# Patient Record
Sex: Male | Born: 1995 | Race: White | Hispanic: No | Marital: Single | Smoking: Never smoker
Health system: Southern US, Community
[De-identification: ages and names within clinical notes are randomized; demographics above are authoritative.]

## PROBLEM LIST (undated history)

## (undated) DIAGNOSIS — R0683 Snoring: Secondary | ICD-10-CM

## (undated) DIAGNOSIS — M419 Scoliosis, unspecified: Secondary | ICD-10-CM

## (undated) DIAGNOSIS — T7840XA Allergy, unspecified, initial encounter: Secondary | ICD-10-CM

## (undated) DIAGNOSIS — I739 Peripheral vascular disease, unspecified: Secondary | ICD-10-CM

## (undated) HISTORY — DX: Scoliosis, unspecified: M41.9

## (undated) HISTORY — DX: Allergy, unspecified, initial encounter: T78.40XA

## (undated) HISTORY — DX: Peripheral vascular disease, unspecified: I73.9

## (undated) HISTORY — DX: Snoring: R06.83

---
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
## 2014-08-09 ENCOUNTER — Encounter: Payer: Self-pay | Admitting: Pediatrics

## 2014-10-15 ENCOUNTER — Ambulatory Visit (INDEPENDENT_AMBULATORY_CARE_PROVIDER_SITE_OTHER): Payer: BLUE CROSS/BLUE SHIELD | Admitting: Pediatrics

## 2014-10-15 DIAGNOSIS — Z111 Encounter for screening for respiratory tuberculosis: Secondary | ICD-10-CM

## 2014-10-15 NOTE — Progress Notes (Signed)
Patient received a PPD on left forearm. No reaction noted. [place] [employer] was formed. Patient must return in 48-72 hours to have test read. If test not read within time frame patient must have test redone. Patient does not have forms to fill out but will need a copy of immunization record and results printed out when patient returns and be faxed in. Patient will have fax number on Wednesday for us, he was not sure of the number today. Lot #: [id number] Expire: 02/17 NDC: [id number]

## 2014-10-17 ENCOUNTER — Ambulatory Visit (INDEPENDENT_AMBULATORY_CARE_PROVIDER_SITE_OTHER): Payer: Self-pay | Admitting: Pediatrics

## 2014-10-17 DIAGNOSIS — Z111 Encounter for screening for respiratory tuberculosis: Secondary | ICD-10-CM

## 2014-10-17 DIAGNOSIS — Z7689 Persons encountering health services in other specified circumstances: Secondary | ICD-10-CM

## 2014-10-17 LAB — TB SKIN TEST
INDURATION: 0 mm
TB Skin Test: NEGATIVE

## 2014-10-17 NOTE — Progress Notes (Signed)
PPD 0 mm

## 2015-04-23 ENCOUNTER — Other Ambulatory Visit: Payer: Self-pay | Admitting: *Deleted

## 2015-04-23 DIAGNOSIS — I83812 Varicose veins of left lower extremities with pain: Secondary | ICD-10-CM

## 2015-04-26 ENCOUNTER — Encounter: Payer: Self-pay | Admitting: Vascular Surgery

## 2015-05-01 ENCOUNTER — Encounter: Payer: Self-pay | Admitting: Vascular Surgery

## 2015-05-01 ENCOUNTER — Encounter (HOSPITAL_COMMUNITY): Payer: Self-pay

## 2015-05-01 ENCOUNTER — Ambulatory Visit (INDEPENDENT_AMBULATORY_CARE_PROVIDER_SITE_OTHER): Payer: BLUE CROSS/BLUE SHIELD | Admitting: Vascular Surgery

## 2015-05-01 ENCOUNTER — Ambulatory Visit (HOSPITAL_COMMUNITY)
Admission: RE | Admit: 2015-05-01 | Discharge: 2015-05-01 | Disposition: A | Discharge status: Home / Self Care | Payer: BLUE CROSS/BLUE SHIELD | Source: Ambulatory Visit | Attending: Vascular Surgery | Admitting: Vascular Surgery

## 2015-05-01 VITALS — BP 103/66 | HR 78 | Temp 97.1°F | Resp 16 | Ht 71.0 in | Wt 152.0 lb

## 2015-05-01 DIAGNOSIS — I872 Venous insufficiency (chronic) (peripheral): Secondary | ICD-10-CM

## 2015-05-01 DIAGNOSIS — I83812 Varicose veins of left lower extremities with pain: Secondary | ICD-10-CM | POA: Diagnosis not present

## 2015-05-01 NOTE — Progress Notes (Signed)
[facility]  Patient name: Erik Courteter G Lucente MRN: [id number] DOB: 05/05/1996 Sex: male  REASON FOR CONSULT: Varicose veins left leg  HPI: Erik Hunter is a 19 y.o. 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 These have gradually gotten worse and therefore he comes in for a vascular evaluation. He does experiencing aching pain in his legs with prolonged standing. Otherwise the veins are not especially symptomatic. He denies any previous history of DVT. It sounds like he had an episode of phlebitis back in October. A soccer ball got him in the back of the left calf and he had some significant bruising and induration in this area. Slowly this resolved.  He does have some family history of varicose veins in that his grandmother had real problems with varicose veins.  Past Medical History  Diagnosis Date  . Snoring   . Allergy   . Asthma   . Scoliosis     History reviewed. No pertinent family history.  His grandmother had varicose veins.  SOCIAL HISTORY: Social History   Social History  . Marital Status: Single    Spouse Name: N/A  . Number of Children: N/A  . Years of Education: N/A   Occupational History  . Not on file.   Social History Main Topics  . Smoking status: Never Smoker   . Smokeless tobacco: Never Used  . Alcohol Use: No  . Drug Use: No  . Sexual Activity: No   Other Topics Concern  . Not on file   Social History Narrative    Allergies  Allergen Reactions  . Peanut-Containing Drug Products     Current Outpatient Prescriptions  Medication Sig Dispense Refill  . EPINEPHrine (EPIPEN) 0.3 mg/0.3 mL SOAJ injection Inject 0.3 mLs (0.3 mg total) into the muscle once. 2 Device 3  . MINOCYCLINE HCL PO Take 1 tablet by mouth 2 (two) times daily.     No current facility-administered medications for this visit.    REVIEW OF SYSTEMS:  [X]  denotes positive finding, [ ]  denotes negative finding Cardiac   Comments:  Chest pain or chest pressure:    Shortness of breath upon exertion:    Short of breath when lying flat:    Irregular heart rhythm:        Vascular    Pain in calf, thigh, or hip brought on by ambulation:    Pain in feet at night that wakes you up from your sleep:     Blood clot in your veins:    Leg swelling:         Pulmonary    Oxygen at home:    Productive cough:     Wheezing:         Neurologic    Sudden weakness in arms or legs:     Sudden numbness in arms or legs:     Sudden onset of difficulty speaking or slurred speech:    Temporary loss of vision in one eye:     Problems with dizziness:         Gastrointestinal    Blood in stool:     Vomited blood:         Genitourinary    Burning when urinating:     Blood in urine:        Psychiatric    Major depression:         Hematologic    Bleeding problems:  Problems with blood clotting too easily:        Skin    Rashes or ulcers:        Constitutional    Fever or chills:      PHYSICAL EXAM: Filed Vitals:   05/01/15 1040  BP: 103/66  Pulse: 78  Temp: 97.1 F (36.2 C)  TempSrc: Oral  Resp: 16  Height:  (1.803 m)  Weight: 152 lb (68.947 kg)  SpO2: 99%    GENERAL: The patient is a well-nourished male, in no acute distress. The vital signs are documented above. CARDIAC: There is a regular rate and rhythm.  VASCULAR: I do not detect carotid bruits. He has palpable femoral, popliteal, and pedal pulses. PULMONARY: There is good air exchange bilaterally without wheezing or rales. ABDOMEN: Soft and non-tender with normal pitched bowel sounds.  MUSCULOSKELETAL: There are no major deformities or cyanosis. NEUROLOGIC: No focal weakness or paresthesias are detected. SKIN: he has a cluster of varicose veins in his posterior left calf. PSYCHIATRIC: The patient has a normal affect.  DATA:   LOWER EXTREMITY VENOUS DUPLEX: I have independently interpreted his left lower extremity venous duplex  evaluation. He has no evidence of DVT. He does have deep vein reflux in the common femoral vein, femoral vein, and popliteal vein. There is some reflux in the saphenofemoral junction but no reflux in the great saphenous vein. He does have significant reflux in the small saphenous vein on the left.  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 In addition he has deep venous reflux proximal to that. We have discussed the importance of intermittent leg elevation in the proper positioning for this. In addition I have written him a prescription for knee-high compression stockings with a gradient of 15-20 mmHg. I have encouraged him to avoid prolonged sitting and standing. Walking and exercise is good. Also explained getting in the pool was good as it also provide some compression of the veins. If his symptoms progress he could potentially be considered for laser ablation of the short saphenous vein although we would try him in thigh-high compression stockings with a gradient of 20-30 mmHg before considering this. He will let us know if his symptoms progress and we'll plan on seeing him back as needed.   Waverly Ferrari [facility] Beeper: [phone]

## 2015-05-20 ENCOUNTER — Other Ambulatory Visit: Payer: Self-pay | Admitting: Urology

## 2015-05-20 DIAGNOSIS — I861 Scrotal varices: Secondary | ICD-10-CM

## 2015-07-25 ENCOUNTER — Ambulatory Visit
Admission: RE | Admit: 2015-07-25 | Discharge: 2015-07-25 | Disposition: A | Discharge status: Home / Self Care | Payer: Self-pay | Source: Ambulatory Visit | Attending: Urology | Admitting: Urology

## 2015-07-25 DIAGNOSIS — I861 Scrotal varices: Secondary | ICD-10-CM

## 2015-07-25 NOTE — Consult Note (Signed)
Chief Complaint: Patient was seen in consultation today for treatment of a left varicocele at the request of Erik Hunter  Referring Physician(s): Hunter,Erik  History of Present Illness: Erik Hunter is a 20 y.o. male with a history of a known left varicocele since roughly the age of 20 years old. The varicocele has become increasingly prominent over time. He does complain of some occasional discomfort while sitting, in the region of the left scrotum, but does not have any significant pain with exertion or exercise. He has not had any documentation of a right-sided varicocele. Erik Hunter does have a history of a palpable posterior left calf varicose vein which has been evaluated by Dr. Gae Hunter. This is asymptomatic and he has not undergone any varicose vein treatment.  On physical examination, it has been noted that he has mild testicular atrophy and a prominent palpable varicocele. He did undergo sperm analysis on 05/15/2015 demonstrating mild reduction in sperm concentration, diminished sperm motility, diminished sperm forward progression and decrease in normal sperm forms. Total sperm count was within normal limits.  Past Medical History  Diagnosis Date  . Snoring   . Allergy   . Asthma   . Scoliosis   The patient's primary care physician is Erik Hunter.  No past surgical history on file.  Allergies: Peanut-containing drug products  Medications: Prior to Admission medications   Medication Sig Start Date End Date Taking? Authorizing Provider  EPINEPHrine (EPIPEN) 0.3 mg/0.3 mL SOAJ injection Inject 0.3 mLs (0.3 mg total) into the muscle once. 12/29/12   Erik Solders, MD  MINOCYCLINE HCL PO Take 1 tablet by mouth 2 (two) times daily.    Historical Provider, MD     No family history on file.  Social History   Social History  . Marital Status: Single    Spouse Name: N/A  . Number of Children: N/A  . Years of Education: N/A   Social History Main  Topics  . Smoking status: Never Smoker   . Smokeless tobacco: Never Used  . Alcohol Use: 0.0 oz/week    0 Standard drinks or equivalent per week     Comment: Occasionally   . Drug Use: No  . Sexual Activity: No   Other Topics Concern  . Not on file   Social History Narrative  Junior in college at [employer] and [employer].  He is premed and interested in medicine. Father Erik Hunter is a Copywriter, advertising in town.   Review of Systems: A 12 point ROS discussed and pertinent positives are indicated in the HPI above.  All other systems are negative.  Review of Systems  Constitutional: Negative.   HENT: Negative.   Respiratory: Negative.   Cardiovascular: Negative.   Gastrointestinal: Negative.   Endocrine: Negative.   Genitourinary: Negative.   Musculoskeletal: Negative.   Skin: Negative.   Neurological: Negative.      Vital Signs: BP 104/59 mmHg  Pulse 78  Temp(Src) 97.7 F (36.5 C) (Oral)  Resp 14  Ht _0  (1.803 m)  Wt 160 lb (72.576 kg)  BMI 22.33 kg/m2  SpO2 99%  Physical Exam  Constitutional: He appears well-developed and well-nourished. No distress.  Genitourinary: Penis normal.  No palpable inguinal lymph nodes or hernias.  Scrotal exam demonstrates a 3+ palpable left varicocele which distends with Valsalva maneuver.  The left testis is approximately 25-30% smaller in volume by palpation compared to the right.  No palpable right varicocele.  No palpable testicular or scrotal masses.  Skin: He  is not diaphoretic.  Nursing note and vitals reviewed.   Imaging: No results found.  Labs:  None  Sperm analysis from [facility] received by fax and reviewed.  Assessment and Plan:  I met with Erik Hunter, his mother and his father Erik Hunter.  We reviewed the pathophysiology of varicocele and indications for treatment. Treatment techniques were discussed including surgical ligation and transcatheter embolization to treat varicocele.  The current clinical indication to treat would be for the prevention of potential future infertility given abnormal sperm analysis and evidence of left testicular atrophy on physical examination. There is a prominent varicocele palpable on physical examination which shows distention with Valsalva maneuver, consistent with testicular vein incompetence.  Details of transcatheter embolization were discussed including technical details of catheter placement, coil embolization and expected postprocedural course. Potential complications and recovery were also discussed including the risk of bleeding and the potential for some acute inflammation secondary to thrombophlebitis of the embolized testicular vein branches and main left testicular vein. Transcatheter venography would be performed prior to embolization to confirm the presence of retrograde flow in the left testicular vein. Expected outcomes were also discussed including risk of recurrent recanalization of a varicocele after either surgical ligation or embolization. Recovery from a transcatheter procedure is usually quite quick and the only limitation would be initial gradual increase in activity, including exercise, and not being able to travel by airplane for one month.  The procedure is performed on an outpatient basis with IV conscious sedation. After discussing the procedure, Erik Hunter was like to ultimately have the varicocele treated by transcatheter means. We will begin the authorization process. He is currently favoring having treatment sometime in the summer after school is out and after taking the MCAT.  Thank you for this interesting consult.  I greatly enjoyed meeting Erik Hunter and look forward to participating in their care.  A copy of this report was sent to the requesting provider on this date.  Electronically SignedAletta Edouard T 07/25/2015, 10:20 AM     I spent a total of 40 Minutes in face to face in clinical consultation,  greater than 50% of which was counseling/coordinating care for left testicular varicocele with associated left testicular atrophy and abnormal sperm analysis.

## 2015-09-05 ENCOUNTER — Other Ambulatory Visit (HOSPITAL_COMMUNITY): Payer: Self-pay | Admitting: Interventional Radiology

## 2015-09-05 DIAGNOSIS — I861 Scrotal varices: Secondary | ICD-10-CM

## 2015-09-10 ENCOUNTER — Telehealth: Payer: Self-pay | Admitting: Vascular Surgery

## 2015-09-10 NOTE — Telephone Encounter (Signed)
sched appt 12/31/15 at 9. Spoke to pt's mother to inform pt of appt.

## 2015-09-10 NOTE — Telephone Encounter (Signed)
-----   Message from Micah FlesherSonya D Rankin, RN sent at 08/30/2015  9:44 AM EDT ----- Regarding: scheduling Please set up a VV follow up appointment (NO lab needed) for December 31, 2015 (morning appointment please) with Dr. Hart RochesterLawson.  Please call his mother Corrie Dandy(Mary Marines) to set up appointment at [phone].  Thanks!

## 2015-10-25 ENCOUNTER — Other Ambulatory Visit: Payer: Self-pay | Admitting: Radiology

## 2015-10-28 ENCOUNTER — Ambulatory Visit (HOSPITAL_COMMUNITY)
Admission: RE | Admit: 2015-10-28 | Discharge: 2015-10-28 | Disposition: A | Discharge status: Home / Self Care | Payer: BLUE CROSS/BLUE SHIELD | Source: Ambulatory Visit | Attending: Interventional Radiology | Admitting: Interventional Radiology

## 2015-10-28 ENCOUNTER — Encounter (HOSPITAL_COMMUNITY): Payer: Self-pay

## 2015-10-28 ENCOUNTER — Other Ambulatory Visit (HOSPITAL_COMMUNITY): Payer: Self-pay | Admitting: Interventional Radiology

## 2015-10-28 DIAGNOSIS — M419 Scoliosis, unspecified: Secondary | ICD-10-CM | POA: Diagnosis not present

## 2015-10-28 DIAGNOSIS — N500 Atrophy of testis: Secondary | ICD-10-CM | POA: Insufficient documentation

## 2015-10-28 DIAGNOSIS — I861 Scrotal varices: Secondary | ICD-10-CM | POA: Diagnosis not present

## 2015-10-28 DIAGNOSIS — J45909 Unspecified asthma, uncomplicated: Secondary | ICD-10-CM | POA: Insufficient documentation

## 2015-10-28 LAB — BASIC METABOLIC PANEL
ANION GAP: 7 (ref 5–15)
BUN: 15 mg/dL (ref 6–20)
CALCIUM: 9.7 mg/dL (ref 8.9–10.3)
CO2: 28 mmol/L (ref 22–32)
CREATININE: 1.04 mg/dL (ref 0.61–1.24)
Chloride: 103 mmol/L (ref 101–111)
GFR calc Af Amer: >60 mL/min (ref >60)
Glucose, Bld: 92 mg/dL (ref 65–99)
Potassium: 3.9 mmol/L (ref 3.5–5.1)
SODIUM: 138 mmol/L (ref 135–145)

## 2015-10-28 LAB — CBC
HCT: 44.8 % (ref 39.0–52.0)
HEMOGLOBIN: 15.1 g/dL (ref 13.0–17.0)
MCH: 29.7 pg (ref 26.0–34.0)
MCHC: 33.7 g/dL (ref 30.0–36.0)
MCV: 88.2 fL (ref 78.0–100.0)
PLATELETS: 189 10*3/uL (ref 150–400)
RBC: 5.08 MIL/uL (ref 4.22–5.81)
RDW: 12.7 % (ref 11.5–15.5)
WBC: 6.2 10*3/uL (ref 4.0–10.5)

## 2015-10-28 LAB — PROTIME-INR
INR: 0.98 (ref 0.00–1.49)
PROTHROMBIN TIME: 13.2 s (ref 11.6–15.2)

## 2015-10-28 LAB — APTT: APTT: 26 s (ref 24–37)

## 2015-10-28 MED ORDER — FENTANYL CITRATE (PF) 100 MCG/2ML IJ SOLN
INTRAMUSCULAR | Status: AC | PRN
  Administered 2015-10-28 (×5): 50 ug via INTRAVENOUS

## 2015-10-28 MED ORDER — FENTANYL CITRATE (PF) 100 MCG/2ML IJ SOLN
INTRAMUSCULAR | Status: AC
  Filled 2015-10-28: qty 4

## 2015-10-28 MED ORDER — LIDOCAINE HCL 1 % IJ SOLN
INTRAMUSCULAR | Status: DC | PRN
  Administered 2015-10-28: 8 mL

## 2015-10-28 MED ORDER — SODIUM CHLORIDE 0.9 % IV SOLN: Freq: Once | INTRAVENOUS | Status: DC

## 2015-10-28 MED ORDER — HYDROCODONE-ACETAMINOPHEN 5-325 MG PO TABS: 1.0000 | ORAL_TABLET | ORAL | Status: DC | PRN

## 2015-10-28 MED ORDER — IOHEXOL 300 MG/ML  SOLN
150.0000 mL | Freq: Once | INTRAMUSCULAR | Status: AC | PRN
  Administered 2015-10-28: 75 mL via INTRAVENOUS

## 2015-10-28 MED ORDER — SODIUM CHLORIDE 0.9 % IV SOLN: INTRAVENOUS | Status: DC

## 2015-10-28 MED ORDER — MIDAZOLAM HCL 2 MG/2ML IJ SOLN
INTRAMUSCULAR | Status: AC | PRN
  Administered 2015-10-28: 2 mg via INTRAVENOUS
  Administered 2015-10-28 (×3): 1 mg via INTRAVENOUS

## 2015-10-28 MED ORDER — LIDOCAINE HCL 1 % IJ SOLN
INTRAMUSCULAR | Status: AC
  Administered 2015-10-28: 10 mL
  Filled 2015-10-28: qty 20

## 2015-10-28 MED ORDER — FENTANYL CITRATE (PF) 100 MCG/2ML IJ SOLN
INTRAMUSCULAR | Status: AC
  Filled 2015-10-28: qty 2

## 2015-10-28 MED ORDER — ATROPINE SULFATE 1 MG/ML IJ SOLN
INTRAMUSCULAR | Status: AC
  Filled 2015-10-28: qty 1

## 2015-10-28 MED ORDER — MIDAZOLAM HCL 2 MG/2ML IJ SOLN
INTRAMUSCULAR | Status: AC
  Filled 2015-10-28: qty 2

## 2015-10-28 MED ORDER — KETOROLAC TROMETHAMINE 30 MG/ML IJ SOLN
30.0000 mg | Freq: Once | INTRAMUSCULAR | Status: AC
  Administered 2015-10-28: 30 mg via INTRAVENOUS

## 2015-10-28 MED ORDER — MIDAZOLAM HCL 2 MG/2ML IJ SOLN
INTRAMUSCULAR | Status: AC
  Filled 2015-10-28: qty 4

## 2015-10-28 NOTE — Sedation Documentation (Signed)
Patient is resting comfortably. 

## 2015-10-28 NOTE — H&P (Signed)
Chief Complaint: Patient was seen in consultation today for left testicular venogram with embolization at the request of Dr Marcine Matar  Referring Physician(s): Dr Marcine Matar  Supervising Physician: Irish Lack  Patient Status: Outpatient  History of Present Illness: Erik Hunter is a 20 y.o. 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 The varicocele has become increasingly prominent over time. He does complain of some occasional discomfort while sitting, in the region of the left scrotum, but does not have any significant pain with exertion or exercise. He has not had any documentation of a right-sided varicocele. Erik Hunter does have a history of a palpable posterior left calf varicose vein which has been evaluated by Dr. Cari Caraway. This is asymptomatic and he has not undergone any varicose vein treatment.  Referred to Dr Fredia Sorrow for evaluation and possible treatment Consult 07/25/2015 Discussed at length treatment option of transcatheter embolization with pt and parents Now scheduled for same    Past Medical History  Diagnosis Date  . Snoring   . Allergy   . Asthma   . Scoliosis     History reviewed. No pertinent past surgical history.  Allergies: Peanut-containing drug products  Medications: Prior to Admission medications   Medication Sig Start Date End Date Taking? Authorizing Provider  EPINEPHrine (EPIPEN) 0.3 mg/0.3 mL SOAJ injection Inject 0.3 mLs (0.3 mg total) into the muscle once. Patient not taking: Reported on 10/25/2015 12/29/12   Georgiann Hahn, MD     History reviewed. No pertinent family history.  Social History   Social History  . Marital Status: Single    Spouse Name: N/A  . Number of Children: N/A  . Years of Education: N/A   Social History Main Topics  . Smoking status: Never Smoker   . Smokeless tobacco: Never Used  . Alcohol Use: 0.0 oz/week    0 Standard drinks or equivalent per week   Comment: Occasionally   . Drug Use: No  . Sexual Activity: No   Other Topics Concern  . None   Social History Narrative    Review of Systems: A 12 point ROS discussed and pertinent positives are indicated in the HPI above.  All other systems are negative.  Review of Systems  Constitutional: Negative for fever, activity change, appetite change and fatigue.  Respiratory: Negative for cough and shortness of breath.   Cardiovascular: Negative for chest pain.  Gastrointestinal: Negative for abdominal pain.  Genitourinary: Positive for scrotal swelling and testicular pain. Negative for hematuria and difficulty urinating.  Musculoskeletal: Negative for back pain and gait problem.  Neurological: Negative for weakness.  Psychiatric/Behavioral: Negative for behavioral problems and confusion.    Vital Signs: BP 121/64 mmHg  Pulse 52  Temp(Src) 97.7 F (36.5 C) (Oral)  Ht  (1.803 m)  Wt 165 lb (74.844 kg)  BMI 23.02 kg/m2  SpO2 100%  Physical Exam  Constitutional: He is oriented to person, place, and time. He appears well-nourished.  Cardiovascular: Normal rate, regular rhythm and normal heart sounds.   Pulmonary/Chest: Effort normal and breath sounds normal. He has no wheezes.  Abdominal: Soft. Bowel sounds are normal. There is no tenderness.  Musculoskeletal: Normal range of motion.  Neurological: He is alert and oriented to person, place, and time.  Skin: Skin is warm and dry.  Psychiatric: He has a normal mood and affect. His behavior is normal. Judgment and thought content normal.  Nursing note and vitals reviewed.   Mallampati Score:  MD Evaluation Airway: WNL Heart: WNL Abdomen: WNL Chest/ Lungs: WNL ASA  Classification: 2 Mallampati/Airway Score: One  Imaging: No results found.  Labs:  CBC:  Recent Labs  10/28/15 0715  WBC 6.2  HGB 15.1  HCT 44.8  PLT 189    COAGS:  Recent Labs  10/28/15 0715  INR 0.98  APTT 26    BMP:  Recent Labs   10/28/15 0715  NA 138  K 3.9  CL 103  CO2 28  GLUCOSE 92  BUN 15  CALCIUM 9.7  CREATININE 1.04  GFRNONAA >60  GFRAA >60    LIVER FUNCTION TESTS: No results for input(s): BILITOT, AST, ALT, ALKPHOS, PROT, ALBUMIN in the last 8760 hours.  TUMOR MARKERS: No results for input(s): AFPTM, CEA, CA199, CHROMGRNA in the last 8760 hours.  Assessment and Plan:  Details of transcatheter embolization were discussed including technical details of catheter placement, coil embolization and expected postprocedural course. Potential complications and recovery were also discussed including the risk of bleeding and the potential for some acute inflammation secondary to thrombophlebitis of the embolized testicular vein branches and main left testicular vein. Transcatheter venography would be performed prior to embolization to confirm the presence of retrograde flow in the left testicular vein. Expected outcomes were also discussed including risk of recurrent recanalization of a varicocele after either surgical ligation or embolization. Recovery from a transcatheter procedure is usually quite quick and the only limitation would be initial gradual increase in activity, including exercise, and not being able to travel by airplane for one month.  Pt and family agreeable to proceed Consent signed andin chart  Thank you for this interesting consult.  I greatly enjoyed meeting Erik Hunter and look forward to participating in their care.  A copy of this report was sent to the requesting provider on this date.  Electronically Signed: Kaydie Petsch A 10/28/2015, 8:34 AM   I spent a total of  30 Minutes   in face to face in clinical consultation, greater than 50% of which was counseling/coordinating care for left testiculr varicocele venogram with probable embolization

## 2015-10-28 NOTE — Sedation Documentation (Signed)
Patient denies pain and is resting comfortably.  

## 2015-10-28 NOTE — Procedures (Signed)
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  Embolization of vein with Ruby coils of various diameter. 17 coils used.  See full dictated procedural note.  Plan:  2 hour bedrest.  Clinic follow-up in 4-6 weeks.  Jodi MarbleGlenn T. Fredia SorrowYamagata, M.D Pager:  [phone]

## 2015-10-28 NOTE — Discharge Instructions (Signed)
Needle Biopsy, Care After °Refer to this sheet in the next few weeks. These instructions provide you with information about caring for yourself after your procedure. Your health care provider may also give you more specific instructions. Your treatment has been planned according to current medical practices, but problems sometimes occur. Call your health care provider if you have any problems or questions after your procedure. °WHAT TO EXPECT AFTER THE PROCEDURE °After your procedure, it is common to have soreness, bruising, or mild pain at the biopsy site. This should go away in a few days. °HOME CARE INSTRUCTIONS °· Rest as directed by your health care provider. °· Take medicines only as directed by your health care provider. °· There are many different ways to close and cover the biopsy site, including stitches (sutures), skin glue, and adhesive strips. Follow your health care provider's instructions about: °¨ Biopsy site care. °¨ Bandage (dressing) changes and removal. °¨ Biopsy site closure removal. °· Check your biopsy site every day for signs of infection. Watch for: °¨ Redness, swelling, or pain. °¨ Fluid, blood, or pus. °SEEK MEDICAL CARE IF: °· You have a fever. °· You have redness, swelling, or pain at the biopsy site that lasts longer than a few days. °· You have fluid, blood, or pus coming from the biopsy site. °· You feel nauseous. °· You vomit. °SEEK IMMEDIATE MEDICAL CARE IF: °· You have shortness of breath. °· You have trouble breathing. °· You have chest pain.   °· You feel dizzy or you faint. °· You have bleeding that does not stop with pressure or a bandage. °· You cough up blood. °· You have pain in your abdomen. °  °This information is not intended to replace advice given to you by your health care provider. Make sure you discuss any questions you have with your health care provider. °  °Document Released: 09/11/2014 Document Reviewed: 09/11/2014 °Elsevier Interactive Patient Education ©2016  [employer] ° °

## 2015-10-31 ENCOUNTER — Other Ambulatory Visit: Payer: Self-pay | Admitting: Urology

## 2015-10-31 DIAGNOSIS — I861 Scrotal varices: Secondary | ICD-10-CM

## 2015-12-26 ENCOUNTER — Ambulatory Visit
Admission: RE | Admit: 2015-12-26 | Discharge: 2015-12-26 | Disposition: A | Discharge status: Home / Self Care | Payer: Self-pay | Source: Ambulatory Visit | Attending: Urology | Admitting: Urology

## 2015-12-26 ENCOUNTER — Other Ambulatory Visit: Payer: BLUE CROSS/BLUE SHIELD

## 2015-12-26 DIAGNOSIS — I861 Scrotal varices: Secondary | ICD-10-CM

## 2015-12-26 HISTORY — PX: IR GENERIC HISTORICAL: IMG1180011

## 2015-12-26 NOTE — Progress Notes (Signed)
Chief Complaint: Status post transcatheter embolization of left testicular vein on 10/28/2015 to treat left varicocele with associated testicular atrophy and abnormal sperm analysis.  History of Present Illness: Erik Hunter is a 20 y.o. male 2 months post left varicocele repair with transcatheter placement of embolization coils in the left testicular vein. He had some mild left groin discomfort for up to 3 weeks after the procedure which responded well to ibuprofen. He did not have any fever, abdominal pain or urinary symptoms. He is back to regular activity including regular exercise without discomfort. In retrospect, he believes that some periodic discomfort in the scrotum prior to embolization was likely due to varicocele, as this discomfort has completely ceased.  Past Medical History:  Diagnosis Date  . Allergy   . Asthma   . Scoliosis   . Snoring     No past surgical history on file.  Allergies: Peanut-containing drug products  Medications: Prior to Admission medications   Not on File     No family history on file.  Social History   Social History  . Marital status: Single    Spouse name: N/A  . Number of children: N/A  . Years of education: N/A   Social History Main Topics  . Smoking status: Never Smoker  . Smokeless tobacco: Never Used  . Alcohol use 0.0 oz/week     Comment: Occasionally   . Drug use: No  . Sexual activity: No   Other Topics Concern  . None   Social History Narrative  . None     Review of Systems: A 12 point ROS discussed and pertinent positives are indicated in the HPI above.  All other systems are negative.  Review of Systems  Constitutional: Negative.   HENT: Negative.   Respiratory: Negative.   Cardiovascular: Negative.   Gastrointestinal: Negative.   Genitourinary: Negative.   Musculoskeletal: Negative.   Skin: Negative.   Neurological: Negative.     Vital Signs: BP 110/73 (BP Location: Right Arm, Patient  Position: Sitting, Cuff Size: Normal)   Pulse (!) 58   Temp 97.6 F (36.4 C)   Resp 18   Ht 5\' 11"  (1.803 m)   SpO2 100%   Physical Exam  Constitutional: He is oriented to person, place, and time. He appears well-developed and well-nourished. No distress.  Genitourinary:  Genitourinary Comments: Scrotal examination demonstrates no further palpable varicocele. There is stable roughly 25-30 percent volume loss of the left testicle compared to the right. No palpable testicular or scrotal masses. No tenderness elicited.  Neurological: He is alert and oriented to person, place, and time.  Skin: He is not diaphoretic.  Nursing note and vitals reviewed.   Imaging: No results found.  Labs:  CBC:  Recent Labs  10/28/15 0715  WBC 6.2  HGB 15.1  HCT 44.8  PLT 189    COAGS:  Recent Labs  10/28/15 0715  INR 0.98  APTT 26    BMP:  Recent Labs  10/28/15 0715  NA 138  K 3.9  CL 103  CO2 28  GLUCOSE 92  BUN 15  CALCIUM 9.7  CREATININE 1.04  GFRNONAA >60  GFRAA >60    Assessment and Plan:  Erik Hunter is doing very well after left testicular varicocele embolization. He is currently asymptomatic. There is no evidence on examination of a residual palpable varicocele. He is about to [facility] [employer] [facility] for his senior year. He was accompanied today by his father, Erik Hunter. I told Erik Hunter to certainly contact me if he has any new symptoms or notices any significant recurrent varicocele by self examination.   Electronically SignedIrish Lack: Felicia Bloomquist T 12/26/2015, 4:41 PM     I spent a total of  15 Minutes in face to face in clinical consultation, greater than 50% of which was counseling/coordinating care post embolization of a left testicular varicocele.

## 2015-12-31 ENCOUNTER — Ambulatory Visit: Payer: BLUE CROSS/BLUE SHIELD | Admitting: Vascular Surgery

## 2016-04-30 ENCOUNTER — Encounter: Payer: Self-pay | Admitting: Vascular Surgery

## 2016-05-12 ENCOUNTER — Ambulatory Visit (INDEPENDENT_AMBULATORY_CARE_PROVIDER_SITE_OTHER): Payer: PRIVATE HEALTH INSURANCE | Admitting: Vascular Surgery

## 2016-05-12 ENCOUNTER — Encounter: Payer: Self-pay | Admitting: Vascular Surgery

## 2016-05-12 VITALS — BP 121/77 | HR 75 | Temp 97.2°F | Resp 16 | Ht 71.0 in | Wt 155.0 lb

## 2016-05-12 DIAGNOSIS — I83892 Varicose veins of left lower extremities with other complications: Secondary | ICD-10-CM

## 2016-05-12 NOTE — Progress Notes (Signed)
Subjective:     Patient ID: Erik Hunter, male   DOB: 07/16/1995, 21 y.o.   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 He was evaluated by Dr. Cari Carawayhris Dickson in December 2016. He was found to have gross reflux in the left small saphenous vein supplying painful varicosities. These varicosities have become more prominent over the past 12 months. He is tried elastic compression stockings 20-30 millimeter gradient as well as elevation and ibuprofen with no improvement in his symptoms. He develops aching throbbing and burning discomfort in the left calf as well as some swelling in the left ankle as the day progresses. He has no history of DVT thrombophlebitis stasis ulcers or bleeding.  Past Medical History:  Diagnosis Date  . Allergy   . Asthma   . Peripheral vascular disease (HCC)    varicose veins  . Scoliosis   . Snoring     Social History  Substance Use Topics  . Smoking status: Never Smoker  . Smokeless tobacco: Never Used  . Alcohol use 0.0 oz/week     Comment: Occasionally     No family history on file.  Allergies  Allergen Reactions  . Peanut-Containing Drug Products Hives and Shortness Of Breath  . Peanut Oil      Current Outpatient Prescriptions:  .  clindamycin (CLINDAGEL) 1 % gel, , Disp: , Rfl:  .  tretinoin (RETIN-A) 0.05 % cream, , Disp: , Rfl:  .  amoxicillin (AMOXIL) 875 MG tablet, TAKE ONE TABLET BY MOUTH TWICE A DAY FOR 7 DAYS AS DIRECTED, Disp: , Rfl: 0 .  minocycline (MINOCIN,DYNACIN) 50 MG capsule, Take by mouth., Disp: , Rfl:   Vitals:   05/12/16 1441  BP: 121/77  Pulse: 75  Resp: 16  Temp: 97.2 F (36.2 C)  SpO2: 99%  Weight: 155 lb (70.3 kg)  Height: 5\' 11"  (1.803 m)    Body mass index is 21.62 kg/m.         Review of Systems Denies chest pain, dyspnea on exertion, PND, orthopnea, hemoptysis. Healthy patient    Objective:   Physical Exam BP 121/77 (BP Location: Left Arm, Patient Position: Sitting, Cuff Size: Normal)   Pulse 75   Temp 97.2 F (36.2 C)   Resp 16   Ht 5\' 11"  (1.803 m)   Wt 155 lb (70.3 kg)   SpO2 99%   BMI 21.62 kg/m   Gen. well-developed well-nourished male no apparent distress alert and oriented 3 Lungs no rhonchi or wheezing Left leg with bulging varicosities in the posterior calf extending up to the popliteal fossa and down to the midcalf level. A few spider veins in the same vicinity. No hyperpigmentation or ulceration noted distally. 3+ dorsalis pedis and posterior tibial pulse palpable.  I reviewed the venous reflux study which was performed in our vascular lab and this revealed gross reflux and an enlarged left small saphenous vein supplying these painful varicosities. There is some reflux at the left saphenofemoral junction but not throughout the great saphenous vein     Assessment:     #1 painful varicosities left calf causing symptoms which are resistant to conservative measures including 20-30 millimeter graduated compression stockings, elevation, and ibuprofen. Symptoms are affecting his daily living and the varicosities are enlarging    Plan:     Patient needs laser ablation left small saphenous vein +10-20 stab phlebectomy of painful varicosities to be performed as a  single procedure We will proceed with precertification to perform this in the near future and hopefully relieve his symptoms and prevent further progression of this problem

## 2016-05-14 ENCOUNTER — Encounter: Payer: Self-pay | Admitting: Interventional Radiology

## 2016-05-29 ENCOUNTER — Other Ambulatory Visit: Payer: Self-pay | Admitting: *Deleted

## 2016-05-29 DIAGNOSIS — I83812 Varicose veins of left lower extremities with pain: Secondary | ICD-10-CM

## 2016-07-31 ENCOUNTER — Encounter: Payer: Self-pay | Admitting: Vascular Surgery

## 2016-08-05 ENCOUNTER — Encounter: Payer: Self-pay | Admitting: Vascular Surgery

## 2016-08-10 ENCOUNTER — Other Ambulatory Visit: Payer: PRIVATE HEALTH INSURANCE | Admitting: Vascular Surgery

## 2016-08-12 ENCOUNTER — Encounter: Payer: Self-pay | Admitting: Vascular Surgery

## 2016-08-12 ENCOUNTER — Ambulatory Visit (INDEPENDENT_AMBULATORY_CARE_PROVIDER_SITE_OTHER): Payer: PRIVATE HEALTH INSURANCE | Admitting: Vascular Surgery

## 2016-08-12 VITALS — BP 127/77 | HR 54 | Temp 97.2°F | Resp 18 | Ht 71.0 in | Wt 158.0 lb

## 2016-08-12 DIAGNOSIS — I83892 Varicose veins of left lower extremities with other complications: Secondary | ICD-10-CM

## 2016-08-12 HISTORY — PX: ENDOVENOUS ABLATION SAPHENOUS VEIN W/ LASER: SUR449

## 2016-08-12 NOTE — Progress Notes (Signed)
Laser Ablation Procedure    Date: 08/12/2016   Erik Hunter DOB:08/20/1995  Consent signed: Yes    Surgeon:  Dr. Quita Skye. Hart [place]  Procedure: Laser Ablation: left Small Saphenous Vein  BP 127/77 (BP Location: Right Arm, Patient Position: Sitting, Cuff Size: Normal)   Pulse (!) 54   Temp 97.2 F (36.2 C) (Oral)   Resp 18   Ht  (1.803 m)   Wt 158 lb (71.7 kg)   SpO2 100%   BMI 22.04 kg/m   Tumescent Anesthesia: 350 cc 0.9% NaCl with 50 cc Lidocaine HCL with 1% Epi and 15 cc 8.4% NaHCO3  Local Anesthesia: 3 cc Lidocaine HCL and NaHCO3 (ratio 2:1)  Pulsed Mode: 15 watts, delay, 1.0 duration  Total Energy:  1046 Joules             Total Pulses:   70             Total Time: 1:09    Stab Phlebectomy: 10-20 Sites: Calf  Patient tolerated procedure well    Description of Procedure:  After marking the course of the secondary varicosities, the patient was placed on the operating table in the prone position, and the left leg was prepped and draped in sterile fashion.   Local anesthetic was administered and under ultrasound guidance the saphenous vein was accessed with a micro needle and guide wire; then the mirco puncture sheath was placed.  A guide wire was inserted saphenopopliteal junction , followed by a 5 french sheath.  The position of the sheath and then the laser fiber below the junction was confirmed using the ultrasound.  Tumescent anesthesia was administered along the course of the saphenous vein using ultrasound guidance. The patient was placed in Trendelenburg position and protective laser glasses were placed on patient and staff, and the laser was fired at 15 watts continuous mode advancing 1-6mm/second for a total of 1046 joules.   For stab phlebectomies, local anesthetic was administered at the previously marked varicosities, and tumescent anesthesia was administered around the vessels.  Ten to 20 stab wounds were made using the tip of an 11 blade. And using the  vein hook, the phlebectomies were performed using a hemostat to avulse the varicosities.  Adequate hemostasis was achieved.      Steri strips were applied to the stab wounds and ABD pads and thigh high compression stockings were applied.  Ace wrap bandages were applied over the phlebectomy sites and at the top of the saphenopopliteal junction. Blood loss was less than 15 cc.  The patient ambulated out of the operating room having tolerated the procedure well.

## 2016-08-12 NOTE — Progress Notes (Signed)
Subjective:     Patient ID: Erik Hunter, male   DOB: 1995/11/19, 21 y.o.   MRN: [id number]  HPI This 21 year old male had laser ablation of the left small saphenous vein performed under local tumescent anesthesia +10-20 stab phlebectomy of painful varicosities. He tolerated both procedures well. A total of a proximally 1000 J of energy was utilized.  Review of Systems     Objective:   Physical Exam BP 127/77 (BP Location: Right Arm, Patient Position: Sitting, Cuff Size: Normal)   Pulse (!) 54   Temp 97.2 F (36.2 C) (Oral)   Resp 18   Ht  (1.803 m)   Wt 158 lb (71.7 kg)   SpO2 100%   BMI 22.04 kg/m        Assessment:     Well-tolerated laser ablation left small saphenous vein plus multiple stab phlebectomy of painful varicosities performed under local tumescent anesthesia    Plan:     Return in 1 week for venous duplex exam to confirm closure left small saphenous vein and this will complete patient's treatment regimen

## 2016-08-13 ENCOUNTER — Encounter: Payer: Self-pay | Admitting: Vascular Surgery

## 2016-08-17 ENCOUNTER — Encounter: Payer: Self-pay | Admitting: Vascular Surgery

## 2016-08-17 ENCOUNTER — Ambulatory Visit (HOSPITAL_COMMUNITY)
Admission: RE | Admit: 2016-08-17 | Discharge: 2016-08-17 | Disposition: A | Discharge status: Home / Self Care | Payer: PRIVATE HEALTH INSURANCE | Source: Ambulatory Visit | Attending: Vascular Surgery | Admitting: Vascular Surgery

## 2016-08-17 ENCOUNTER — Ambulatory Visit (INDEPENDENT_AMBULATORY_CARE_PROVIDER_SITE_OTHER): Payer: Self-pay | Admitting: Vascular Surgery

## 2016-08-17 VITALS — BP 107/76 | HR 71 | Temp 98.3°F | Resp 16 | Ht 71.0 in | Wt 158.0 lb

## 2016-08-17 DIAGNOSIS — I83812 Varicose veins of left lower extremities with pain: Secondary | ICD-10-CM | POA: Diagnosis not present

## 2016-08-17 DIAGNOSIS — I82812 Embolism and thrombosis of superficial veins of left lower extremities: Secondary | ICD-10-CM | POA: Insufficient documentation

## 2016-08-17 DIAGNOSIS — I83892 Varicose veins of left lower extremities with other complications: Secondary | ICD-10-CM

## 2016-08-17 NOTE — Progress Notes (Signed)
Subjective:     Patient ID: Erik Hunter, male   DOB: 1995-12-16, 21 y.o.   MRN: [id number]  HPI This 21 year old male returns 1 week post-laser ablation left small saphenous vein plus multiple stab phlebectomy of painful varicosities. He said he has had mild discomfort in the posterior calf and no swelling distally. He denies any chest pain dyspnea on exertion PND orthopnea or hemoptysis. He has taken his ibuprofen as instructed and worn her long leg elastic compression stocking.  Review of Systems     Objective:   Physical Exam BP 107/76 (BP Location: Left Arm, Patient Position: Sitting, Cuff Size: Normal)   Pulse 71   Temp 98.3 F (36.8 C)   Resp 16   Ht  (1.803 m)   Wt 158 lb (71.7 kg)   BMI 22.04 kg/m   Gen. well-developed well-nourished male no apparent distress alert and oriented 3 Lungs no rhonchi or wheezing Left leg with mild discomfort to deep palpation over small saphenous vein. Stab phlebectomy sites with Steri-Strips in place all healing satisfactorily. No distal edema.  Today I ordered a venous duplex exam the left leg which I reviewed and interpreted. There is no DVT. There is total closure of the left small saphenous vein up to near the saphenous popliteal junction     Assessment:     Successful laser ablation left small saphenous vein with multiple stab phlebectomy of painful varicosities-no complaints per patient    Plan:     Return to see me on a when necessary basis

## 2018-05-04 ENCOUNTER — Emergency Department (HOSPITAL_COMMUNITY): Payer: No Typology Code available for payment source | Admitting: Certified Registered"

## 2018-05-04 ENCOUNTER — Ambulatory Visit (HOSPITAL_COMMUNITY)
Admission: EM | Admit: 2018-05-04 | Discharge: 2018-05-04 | Disposition: A | Discharge status: Home / Self Care | Payer: No Typology Code available for payment source | Attending: Emergency Medicine | Admitting: Emergency Medicine

## 2018-05-04 ENCOUNTER — Other Ambulatory Visit: Payer: Self-pay

## 2018-05-04 ENCOUNTER — Encounter (HOSPITAL_COMMUNITY)
Admission: EM | Disposition: A | Discharge status: Home / Self Care | Payer: Self-pay | Source: Home / Self Care | Attending: Emergency Medicine

## 2018-05-04 ENCOUNTER — Encounter (HOSPITAL_COMMUNITY): Payer: Self-pay

## 2018-05-04 ENCOUNTER — Emergency Department (HOSPITAL_COMMUNITY): Payer: No Typology Code available for payment source

## 2018-05-04 DIAGNOSIS — R0789 Other chest pain: Secondary | ICD-10-CM

## 2018-05-04 DIAGNOSIS — K228 Other specified diseases of esophagus: Secondary | ICD-10-CM | POA: Diagnosis not present

## 2018-05-04 DIAGNOSIS — M419 Scoliosis, unspecified: Secondary | ICD-10-CM | POA: Insufficient documentation

## 2018-05-04 DIAGNOSIS — I739 Peripheral vascular disease, unspecified: Secondary | ICD-10-CM | POA: Diagnosis not present

## 2018-05-04 DIAGNOSIS — J45909 Unspecified asthma, uncomplicated: Secondary | ICD-10-CM | POA: Diagnosis not present

## 2018-05-04 DIAGNOSIS — Z79899 Other long term (current) drug therapy: Secondary | ICD-10-CM | POA: Diagnosis not present

## 2018-05-04 DIAGNOSIS — K2210 Ulcer of esophagus without bleeding: Secondary | ICD-10-CM | POA: Diagnosis not present

## 2018-05-04 HISTORY — PX: ESOPHAGOGASTRODUODENOSCOPY (EGD) WITH PROPOFOL: SHX5813

## 2018-05-04 HISTORY — PX: BIOPSY: SHX5522

## 2018-05-04 SURGERY — ESOPHAGOGASTRODUODENOSCOPY (EGD) WITH PROPOFOL
Anesthesia: Monitor Anesthesia Care

## 2018-05-04 MED ORDER — PROPOFOL 10 MG/ML IV BOLUS
INTRAVENOUS | Status: AC
  Filled 2018-05-04: qty 40

## 2018-05-04 MED ORDER — ALUM & MAG HYDROXIDE-SIMETH 200-200-20 MG/5ML PO SUSP
30.0000 mL | Freq: Once | ORAL | Status: AC
  Administered 2018-05-04: 30 mL via ORAL
  Filled 2018-05-04: qty 30

## 2018-05-04 MED ORDER — PROPOFOL 10 MG/ML IV BOLUS
INTRAVENOUS | Status: AC
  Filled 2018-05-04: qty 20

## 2018-05-04 MED ORDER — PROPOFOL 10 MG/ML IV BOLUS
INTRAVENOUS | Status: DC | PRN
  Administered 2018-05-04: 30 mg via INTRAVENOUS
  Administered 2018-05-04: 40 mg via INTRAVENOUS
  Administered 2018-05-04 (×3): 30 mg via INTRAVENOUS
  Administered 2018-05-04: 20 mg via INTRAVENOUS
  Administered 2018-05-04: 30 mg via INTRAVENOUS
  Administered 2018-05-04: 10 mg via INTRAVENOUS
  Administered 2018-05-04: 30 mg via INTRAVENOUS

## 2018-05-04 MED ORDER — LACTATED RINGERS IV SOLN
INTRAVENOUS | Status: DC | PRN
  Administered 2018-05-04: 18:00:00 via INTRAVENOUS

## 2018-05-04 MED ORDER — LIDOCAINE VISCOUS HCL 2 % MT SOLN
15.0000 mL | Freq: Once | OROMUCOSAL | Status: DC
  Filled 2018-05-04: qty 15

## 2018-05-04 MED ORDER — NITROGLYCERIN 0.4 MG SL SUBL
0.4000 mg | SUBLINGUAL_TABLET | Freq: Once | SUBLINGUAL | Status: AC
  Administered 2018-05-04: 0.4 mg via SUBLINGUAL
  Filled 2018-05-04: qty 1

## 2018-05-04 MED ORDER — PROPOFOL 500 MG/50ML IV EMUL
INTRAVENOUS | Status: DC | PRN
  Administered 2018-05-04: 135 ug/kg/min via INTRAVENOUS

## 2018-05-04 SURGICAL SUPPLY — 14 items

## 2018-05-04 NOTE — Transfer of Care (Signed)
Immediate Anesthesia Transfer of Care Note  Patient: Erik Hunter  Procedure(s) Performed: ESOPHAGOGASTRODUODENOSCOPY (EGD) WITH PROPOFOL (N/A )  Patient Location: PACU  Anesthesia Type:MAC  Level of Consciousness: awake, alert  and oriented  Airway & Oxygen Therapy: Patient Spontanous Breathing and Patient connected to face mask oxygen  Post-op Assessment: Report given to RN and Post -op Vital signs reviewed and stable  Post vital signs: Reviewed and stable  Last Vitals:  Vitals Value Taken Time  BP    Temp    Pulse    Resp    SpO2      Last Pain:  Vitals:   05/04/18 1530  TempSrc: Oral  PainSc: 5          Complications: No apparent anesthesia complications

## 2018-05-04 NOTE — H&P (Signed)
Erik Hunter is an 22 y.o. male.   Chief Complaint: Mid sternal chest pain HPI: 22 year old male was in his usual state of health until 36 hours prior to presentation when he woke up with midsternal pain described as dull but constant, worsened with food.  The night prior to onset of pain he drank from a beer glass with a chipped rim and is not sure if he swallowed a small piece of glass.  He denies nausea, vomiting, heartburn or acid reflux.  Prior to this incident he has not had difficulty or pain on swallowing and denies recent unintentional weight loss or loss of appetite.  He has tried OTC PPI and Advil without relief.  He last had water at 6:30 AM and has not had any solids since yesterday. Today morning he felt like he tasted iron although he denies vomiting blood. He is able to maintain his secretions, denies drooling or regurgitation, denies shortness of breath or radiation of pain.  Chest x-ray showed minimal hyperinflation without infiltrates and no evidence of pneumothorax. EKG showed a normal sinus rhythm.  Past Medical History:  Diagnosis Date  . Allergy   . Asthma   . Peripheral vascular disease (HCC)    varicose veins  . Scoliosis   . Snoring     Past Surgical History:  Procedure Laterality Date  . ENDOVENOUS ABLATION SAPHENOUS VEIN W/ LASER Left 08/12/2016   endovenous laser ablation left small saphenous vein and stab phlebectomy by Josephina GipJames Lawson MD   . IR GENERIC HISTORICAL  12/26/2015   IR RADIOLOGIST EVAL & MGMT 12/26/2015 Irish LackGlenn Yamagata, MD [facility] INTERV RAD    History reviewed. No pertinent family history. Social History:  reports that he has never smoked. He has never used smokeless tobacco. He reports current alcohol use. He reports that he does not use drugs.  Allergies:  Allergies  Allergen Reactions  . Peanut-Containing Drug Products Hives and Shortness Of Breath  . Peanut Oil     (Not in a hospital admission)   No results found for this or any  previous visit (from the past 48 hour(s)). Dg Chest 2 View  Result Date: 05/04/2018 CLINICAL DATA:  Esophageal pain, feels like food is stuck, feels like esophageal spasm, persistent pain today, history asthma EXAM: CHEST - 2 VIEW COMPARISON:  None FINDINGS: Normal heart size, mediastinal contours, and pulmonary vascularity. Lungs minimally hyperinflated but clear. No infiltrate, pleural effusion or pneumothorax. Bones unremarkable. IMPRESSION: Minimal hyperinflation without infiltrate. Electronically Signed   By: Ulyses SouthwardMark  Boles M.D.   On: 05/04/2018 17:21    ROS  Patient denies skin rashes, joint pain, dizziness, loss of consciousness, burning micturition, palpitations.  Blood pressure (!) 143/82, pulse 71, temperature 98.1 F (36.7 C), temperature source Oral, resp. rate 16, height 6' (1.829 m), weight 74.8 kg, SpO2 97 %. Physical Exam  Alert, awake, appears his stated age and comfortable No pallor, no icterus Normocephalic, atraumatic Equal air entry with vesicular breath sounds bilaterally Regular heart rate and rhythm Abdomen is soft, nontender, tympanitic on percussion and normal bowel sounds Able to move all 4 extremities, no weakness No deformity, pedal edema  Assessment/Plan Acute onset of odynophagia for the last 36 hours, possibility of foreign body/food bolus impaction EGD for direct visualization with possible dilatation and biopsies if needed.  Kerin SalenArya Kenora Spayd, MD 05/04/2018, 5:34 PM

## 2018-05-04 NOTE — ED Triage Notes (Signed)
Pt states that he has esophageal pain. Pt states yesterday it felt like there was food stuck. Pt states it feels like spasms. Pt states that the pain has kept going today. Pt states he tasted iron today. Pt states minor relief with advil. Pt able to swallow saliva and speak without issues.

## 2018-05-04 NOTE — Discharge Instructions (Signed)

## 2018-05-04 NOTE — ED Provider Notes (Signed)
[facility]-EMERGENCY DEPT Provider Note   CSN: [id number] Arrival date & time: 05/04/18  1521     History   Chief Complaint Chief Complaint  Patient presents with  . Gastroesophageal Reflux    HPI Erik Hunter is a 22 y.o. male with history of varicocele, scoliosis, asthma, allergies presents for evaluation of acute onset, progressively worsening sensation of esophageal obstruction.  He reports that yesterday morning when he awoke he felt as though there was something stuck in his chest.  He notes that this sensation has been constant since yesterday but ever since then he has been experiencing sharp stabbing "spasms" to the midline that last for a few seconds and then resolve.  Pain does not radiate.  No aggravating or alleviating factors noted.  He denies difficulty swallowing or drooling.  He is able to tolerate p.o. fluid without difficulty.  He denies shortness of breath, nausea, vomiting, or abdominal pain.  No fevers.  He has tried drinking warm fluids and omeprazole without relief of his symptoms.  Earlier today he experienced a metallic taste in his mouth but when he spat out he noted no blood.  He denies melena, hematochezia, or urinary symptoms.  He did note that when he was drinking from a glass at a bar 2 nights ago there was a chip in the glass and he thinks he may have swallowed a shard of glass.  He is a non-smoker, no family history of cardiac disease at a young age.  The history is provided by the patient.    Past Medical History:  Diagnosis Date  . Allergy   . Asthma   . Peripheral vascular disease (HCC)    varicose veins  . Scoliosis   . Snoring     Patient Active Problem List   Diagnosis Date Noted  . Varicose veins of left lower extremity with complications 05/12/2016  . Well child check 12/29/2012  . UNEQUAL LEG LENGTH 11/21/2009  . SCOLIOSIS , IDIOPATHIC 11/21/2009    Past Surgical History:  Procedure Laterality Date  .  ENDOVENOUS ABLATION SAPHENOUS VEIN W/ LASER Left 08/12/2016   endovenous laser ablation left small saphenous vein and stab phlebectomy by Josephina GipJames Lawson MD   . IR GENERIC HISTORICAL  12/26/2015   IR RADIOLOGIST EVAL & MGMT 12/26/2015 Irish LackGlenn Yamagata, MD [facility] INTER[facility] RAD        Home Medications    Prior to Admission medications   Not on File    Family History History reviewed. No pertinent family history.  Social History Social History   Tobacco Use  . Smoking status: Never Smoker  . Smokeless tobacco: Never Used  Substance Use Topics  . Alcohol use: Yes    Alcohol/week: 0.0 standard drinks    Comment: Occasionally   . Drug use: No     Allergies   Peanut-containing drug products and Peanut oil   Review of Systems Review of Systems  Constitutional: Negative for chills and fever.  Respiratory: Negative for shortness of breath.   Cardiovascular: Positive for chest pain.  Gastrointestinal: Negative for abdominal pain, nausea and vomiting.  All other systems reviewed and are negative.    Physical Exam Updated Vital Signs BP (!) 148/72   Pulse 79   Temp 97.9 F (36.6 C) (Oral)   Resp (!) 26   Ht 6' (1.829 m)   Wt 74.8 kg   SpO2 100%   BMI 22.38 kg/m   Physical Exam Vitals signs and nursing note reviewed.  Constitutional:      General: He is not in acute distress.    Appearance: He is well-developed.     Comments: Resting comfortably in bed  HENT:     Head: Normocephalic and atraumatic.     Jaw: No trismus, swelling or malocclusion.     Comments: No swelling of the lips or tongue.  Tolerating secretions without difficulty.  No upper airway stridor.  No abnormal phonation.    Mouth/Throat:     Lips: Pink.     Mouth: Mucous membranes are moist.     Pharynx: Uvula midline.  Eyes:     General:        Right eye: No discharge.        Left eye: No discharge.     Conjunctiva/sclera: Conjunctivae normal.  Neck:     Vascular: No JVD.     Trachea: No tracheal  deviation.  Cardiovascular:     Rate and Rhythm: Normal rate.     Pulses: Normal pulses.     Heart sounds: Normal heart sounds.     Comments: 2+ radial and DP/PT pulses bilaterally, no lower extremity edema, no palpable cords, compartments are soft  Pulmonary:     Effort: Pulmonary effort is normal.     Breath sounds: Normal breath sounds.  Chest:     Chest wall: No tenderness.  Abdominal:     General: Abdomen is flat. Bowel sounds are normal. There is no distension.     Tenderness: There is no abdominal tenderness.  Skin:    General: Skin is warm and dry.     Findings: No erythema.  Neurological:     Mental Status: He is alert.  Psychiatric:        Behavior: Behavior normal.      ED Treatments / Results  Labs (all labs ordered are listed, but only abnormal results are displayed) Labs Reviewed  SURGICAL PATHOLOGY    EKG EKG Interpretation  Date/Time:  Wednesday May 04 2018 16:54:54 EST Ventricular Rate:  67 PR Interval:  152 QRS Duration: 90 QT Interval:  392 QTC Calculation: 414 R Axis:   78 Text Interpretation:  Normal sinus rhythm Normal ECG No old tracing to compare Confirmed by Azalia Bilisampos, Kevin ([id number]) on 05/04/2018 5:24:42 PM   Radiology Dg Chest 2 View  Result Date: 05/04/2018 CLINICAL DATA:  Esophageal pain, feels like food is stuck, feels like esophageal spasm, persistent pain today, history asthma EXAM: CHEST - 2 VIEW COMPARISON:  None FINDINGS: Normal heart size, mediastinal contours, and pulmonary vascularity. Lungs minimally hyperinflated but clear. No infiltrate, pleural effusion or pneumothorax. Bones unremarkable. IMPRESSION: Minimal hyperinflation without infiltrate. Electronically Signed   By: Ulyses SouthwardMark  Boles M.D.   On: 05/04/2018 17:21    Procedures Procedures (including critical care time)  Medications Ordered in ED Medications  alum & mag hydroxide-simeth (MAALOX/MYLANTA) 200-200-20 MG/5ML suspension 30 mL (30 mLs Oral Given by Other  05/04/18 1653)    And  lidocaine (XYLOCAINE) 2 % viscous mouth solution 15 mL ( Oral MAR Hold 05/04/18 1807)  nitroGLYCERIN (NITROSTAT) SL tablet 0.4 mg (0.4 mg Sublingual Given by Other 05/04/18 1653)     Initial Impression / Assessment and Plan / ED Course  I have reviewed the triage vital signs and the nursing notes.  Pertinent labs & imaging results that were available during my care of the patient were reviewed by me and considered in my medical decision making (see chart for details).      Patient presenting  for evaluation of swallowed foreign body sensation and midline chest discomfort.  He is afebrile, mildly tachypneic but overall well-appearing and in no apparent distress.  Pain is not reproducible on palpation.  He is tolerating secretions without difficulty.  Chest x-ray shows no acute cardiopulmonary abnormalities, no evidence of foreign body.  EKG shows normal sinus rhythm, no ischemic changes or arrhythmia.  Doubt dissection, cardiac tamponade, esophageal rupture, PE, pneumonia, or pneumothorax.  He did have temporary improvement in his symptoms with administration of Maalox and viscous lidocaine as well as a tablet of nitroglycerin but this was short-lived and his discomfort returned shortly thereafter. With ongoing symptoms, plan for EGD for further evaluation. Patient seen by Dr. Patria Mane who agrees with assessment and plan at this time.   Final Clinical Impressions(s) / ED Diagnoses   Final diagnoses:  Chest discomfort    ED Discharge Orders    None       Bennye Alm 05/04/18 1819    Azalia Bilis, MD 05/04/18 2351

## 2018-05-04 NOTE — ED Notes (Signed)
Pt instructed to change into gown.  

## 2018-05-04 NOTE — Brief Op Note (Signed)
05/04/2018  6:04 PM  PATIENT:  Erik Hunter  22 y.o. male  PRE-OPERATIVE DIAGNOSIS:  food bolus, odynophagia  POST-OPERATIVE DIAGNOSIS:  * No post-op diagnosis entered *  PROCEDURE:  Procedure(s): ESOPHAGOGASTRODUODENOSCOPY (EGD) WITH PROPOFOL (N/A)  SURGEON:  Surgeon(s) and Role:    Ronnette Juniper, MD - Primary  PHYSICIAN ASSISTANT:   ASSISTANTS:Shanic Ladoris Gene, Guillame Awake, Tech  ANESTHESIA:   MAC  EBL:  Minimal  BLOOD ADMINISTERED:none  DRAINS: none   LOCAL MEDICATIONS USED:  NONE  SPECIMEN:  Biopsy / Limited Resection  DISPOSITION OF SPECIMEN:  PATHOLOGY  COUNTS:  YES  TOURNIQUET:  * No tourniquets in log *  DICTATION: .Dragon Dictation  PLAN OF CARE: Discharge to home after PACU  PATIENT DISPOSITION:  PACU - hemodynamically stable.   Delay start of Pharmacological VTE agent (>24hrs) due to surgical blood loss or risk of bleeding: no

## 2018-05-04 NOTE — Anesthesia Procedure Notes (Signed)
Procedure Name: MAC Date/Time: 05/04/2018 5:43 PM Performed by: Niel Hummer, CRNA Pre-anesthesia Checklist: Patient identified, Emergency Drugs available, Suction available and Patient being monitored Oxygen Delivery Method: Nasal cannula

## 2018-05-04 NOTE — Op Note (Signed)
[facility] Patient Name: Erik Hunter Procedure Date: 05/04/2018 MRN: [id number] Attending MD: Kerin Salen , MD Date of Birth: 1996-05-05 CSN: [id number] Age: 22 Admit Type: Outpatient Procedure:                Upper GI endoscopy Indications:              Dysphagia, Odynophagia Providers:                Kerin Salen, MD, Berdine Dance,                            Technician Referring MD:              Medicines:                Monitored Anesthesia Care Complications:            No immediate complications. Estimated blood loss:                            Minimal. Estimated Blood Loss:     Estimated blood loss was minimal. Procedure:                Pre-Anesthesia Assessment:                           - Prior to the procedure, a History and Physical                            was performed, and patient medications and                            allergies were reviewed. The patient's tolerance of                            previous anesthesia was also reviewed. The risks                            and benefits of the procedure and the sedation                            options and risks were discussed with the patient.                            All questions were answered, and informed consent                            was obtained. Prior Anticoagulants: The patient has                            taken no previous anticoagulant or antiplatelet                            agents. ASA Grade Assessment: II - A patient with                            mild systemic disease. After  reviewing the risks                            and benefits, the patient was deemed in                            satisfactory condition to undergo the procedure.                           After obtaining informed consent, the endoscope was                            passed under direct vision. Throughout the                            procedure, the patient's blood pressure, pulse,  and                            oxygen saturations were monitored continuously. The                            [id number]-[id number] ([id number] Olympus adult endoscope was                            introduced through the mouth, and advanced to the                            second part of duodenum. The upper GI endoscopy was                            accomplished without difficulty. The patient                            tolerated the procedure well. Scope In: Scope Out: Findings:      Patchy moderate mucosal changes characterized by erythema, nodularity,       altered texture and diminutive ulceration were found in the upper third       of the esophagus, in the middle third of the esophagus and in the lower       third of the esophagus. Biopsies were taken with a cold forceps for       histology.      The Z-line was regular and was found 35 cm from the incisors.      There was no obvious foreign body or luminal narrowing noted.      The entire examined stomach was normal. There was minimal amount of       white particles noted in the entire stomach(patient was given PO       medications in the ER prior to the procedure).      The cardia and gastric fundus were normal on retroflexion.      The examined duodenum was normal. Impression:               - Erythematous, nodular, texture changed, ulcerated  mucosa in the esophagus. Biopsied.                           - Z-line regular, 35 cm from the incisors.                           - Normal stomach.                           - Normal examined duodenum. Moderate Sedation:      Patient did not receive moderate sedation for this procedure, but       instead received monitored anesthesia care. Recommendation:           - Patient has a contact number available for                            emergencies. The signs and symptoms of potential                            delayed complications were discussed with the                             patient. Return to normal activities tomorrow.                            Written discharge instructions were provided to the                            patient.                           - Resume regular diet.                           - Await pathology results. Procedure Code(s):        --- Professional ---                           [phone], Esophagogastroduodenoscopy, flexible,                            transoral; with biopsy, single or multiple Diagnosis Code(s):        --- Professional ---                           K22.8, Other specified diseases of esophagus                           K22.10, Ulcer of esophagus without bleeding                           R13.10, Dysphagia, unspecified CPT copyright 2018 American [employer] Association. All rights reserved. The codes documented in this report are preliminary and upon coder review may  be revised to meet current compliance requirements. Kerin SalenArya Andrena Margerum, MD 05/04/2018 6:04:11 PM This report has been signed electronically. Number of Addenda: 0

## 2018-05-04 NOTE — ED Notes (Signed)
Patient transported to X-ray 

## 2018-05-04 NOTE — Anesthesia Preprocedure Evaluation (Addendum)
Anesthesia Evaluation  Patient identified by MRN, date of birth, ID band Patient awake    Reviewed: Allergy & Precautions, Patient's Chart, lab work & pertinent test results  Airway Mallampati: I  TM Distance: >3 FB Neck ROM: Full    Dental  (+) Teeth Intact, Dental Advisory Given   Pulmonary asthma ,    breath sounds clear to auscultation       Cardiovascular + Peripheral Vascular Disease   Rhythm:Regular Rate:Normal     Neuro/Psych negative neurological ROS     GI/Hepatic negative GI ROS, Neg liver ROS,   Endo/Other  negative endocrine ROS  Renal/GU negative Renal ROS     Musculoskeletal negative musculoskeletal ROS (+)   Abdominal Normal abdominal exam  (+)   Peds  Hematology negative hematology ROS (+)   Anesthesia Other Findings   Reproductive/Obstetrics                            Anesthesia Physical Anesthesia Plan  ASA: II and emergent  Anesthesia Plan: MAC   Post-op Pain Management:    Induction: Intravenous  PONV Risk Score and Plan: Propofol infusion and Ondansetron  Airway Management Planned: Natural Airway and Nasal Cannula  Additional Equipment: None  Intra-op Plan:   Post-operative Plan:   Informed Consent: I have reviewed the patients History and Physical, chart, labs and discussed the procedure including the risks, benefits and alternatives for the proposed anesthesia with the patient or authorized representative who has indicated his/her understanding and acceptance.     Plan Discussed with: CRNA  Anesthesia Plan Comments:        Anesthesia Quick Evaluation

## 2018-05-06 ENCOUNTER — Encounter (HOSPITAL_COMMUNITY): Payer: Self-pay | Admitting: Gastroenterology

## 2018-05-06 NOTE — Anesthesia Postprocedure Evaluation (Signed)
Anesthesia Post Note  Patient: Erik Hunter  Procedure(s) Performed: ESOPHAGOGASTRODUODENOSCOPY (EGD) WITH PROPOFOL (N/A ) BIOPSY     Patient location during evaluation: PACU Anesthesia Type: MAC Level of consciousness: awake and alert Pain management: pain level controlled Vital Signs Assessment: post-procedure vital signs reviewed and stable Respiratory status: spontaneous breathing, nonlabored ventilation, respiratory function stable and patient connected to nasal cannula oxygen Cardiovascular status: stable and blood pressure returned to baseline Postop Assessment: no apparent nausea or vomiting Anesthetic complications: no    Last Vitals:  Vitals:   05/04/18 1820 05/04/18 1825  BP: 121/73 135/60  Pulse: (!) 58 68  Resp: (!) 27 20  Temp:    SpO2: 100% 100%    Last Pain:  Vitals:   05/04/18 1810  TempSrc: Oral  PainSc:                  Effie Berkshire

## 2020-11-17 IMAGING — CR DG CHEST 2V
2 series · 2 of 2 positions shown · non-contrast
Comparison: None

CLINICAL DATA: Esophageal pain, feels like food is stuck, feels
like esophageal spasm, persistent pain today, history asthma

EXAM:
CHEST - 2 VIEW

[w chest pa]
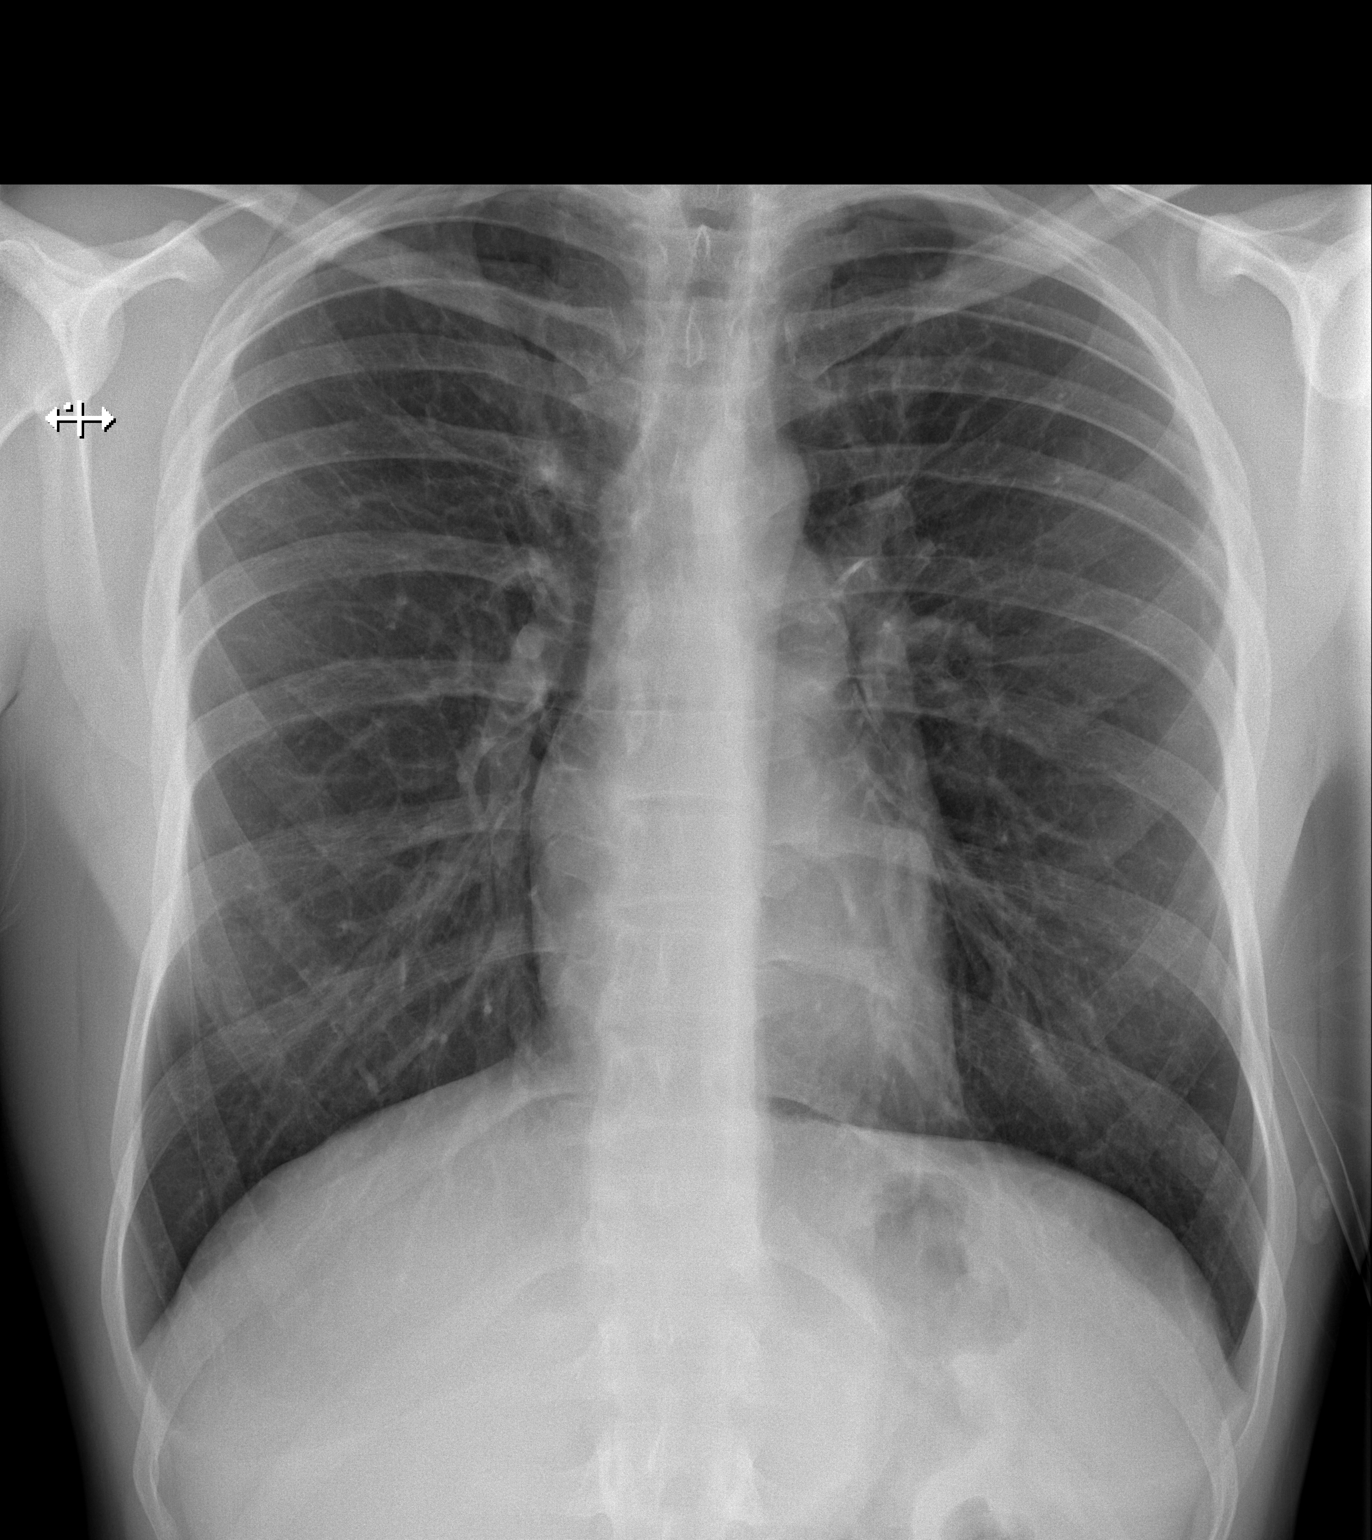

[w chest lat]
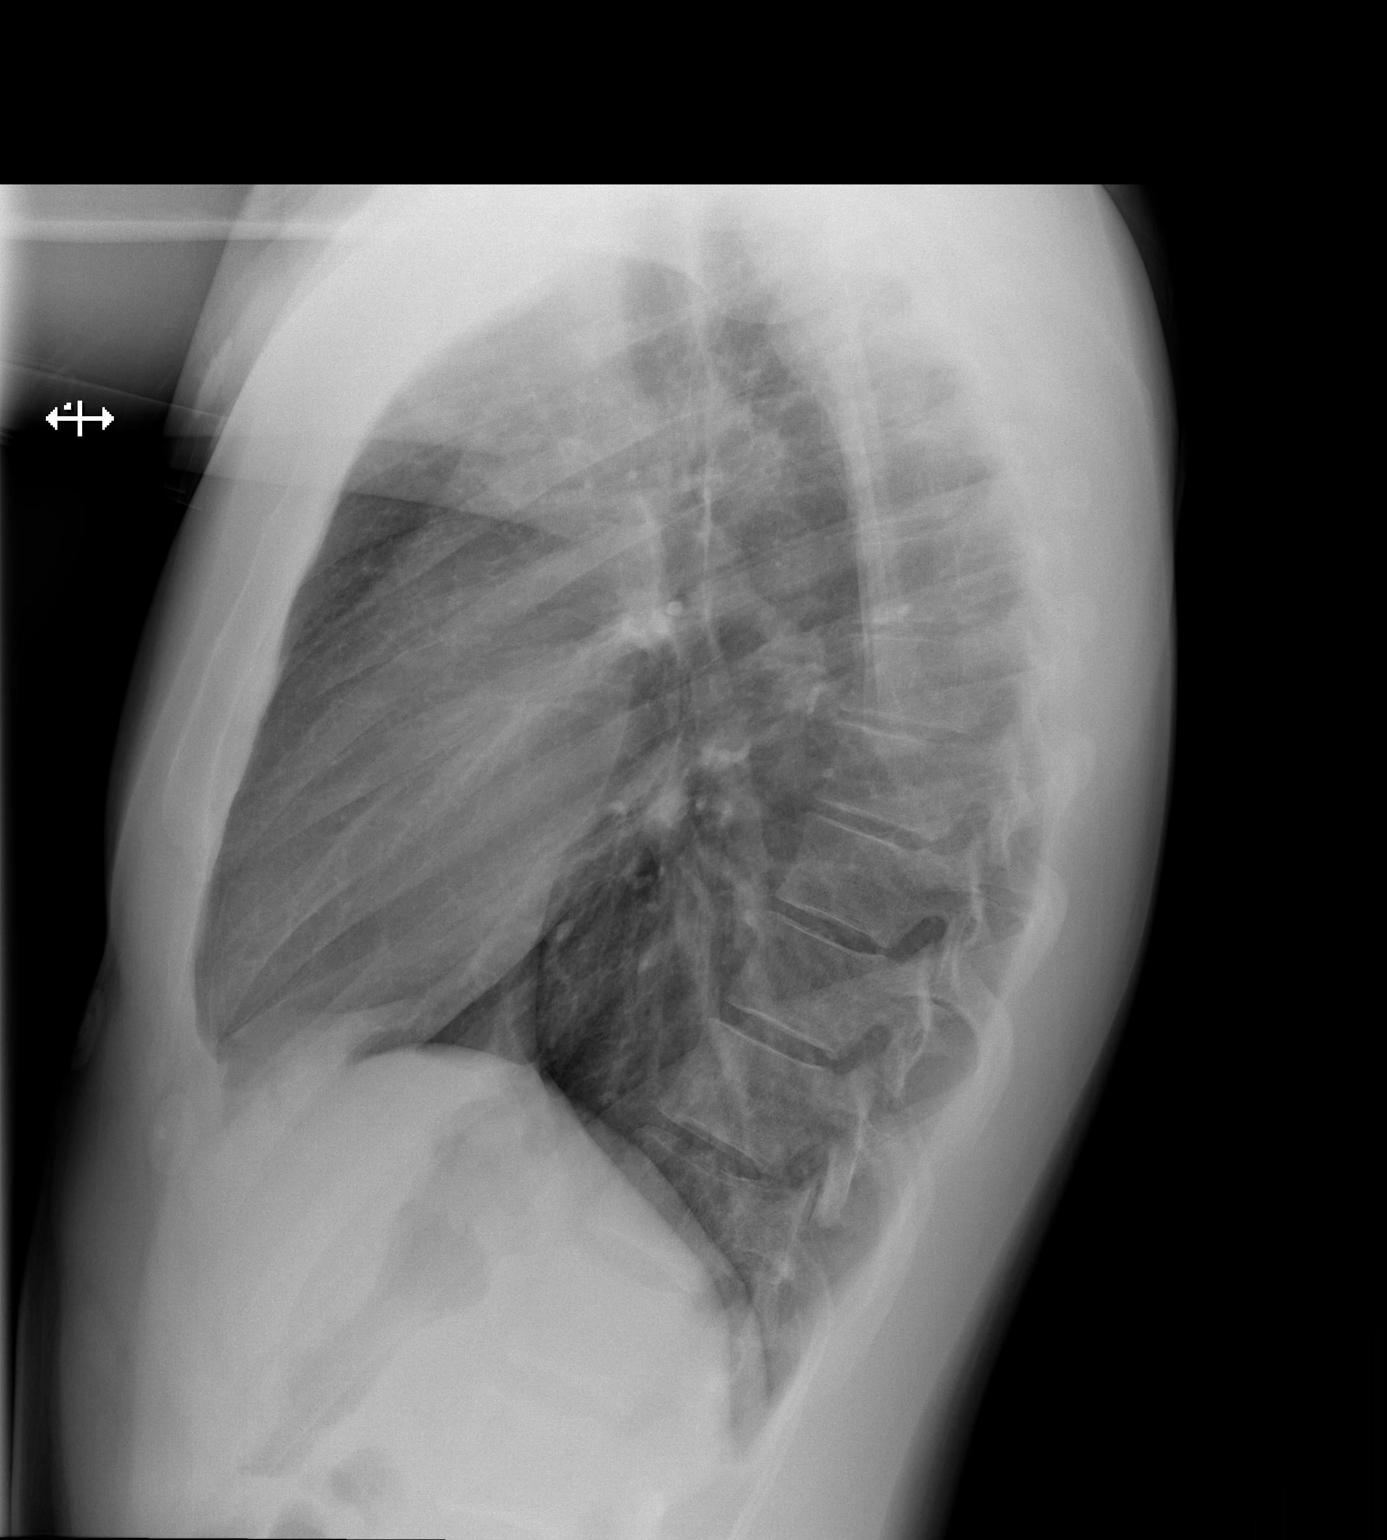

[2 of 2 positions shown; findings below may reference images not displayed]

FINDINGS: Normal heart size, mediastinal contours, and pulmonary vascularity.

Lungs minimally hyperinflated but clear.

No infiltrate, pleural effusion or pneumothorax.

Bones unremarkable.
IMPRESSION: Minimal hyperinflation without infiltrate.
